# Patient Record
Sex: Male | Born: 1995 | Hispanic: Yes | Marital: Single | Smoking: Current some day smoker
Health system: Southern US, Community
[De-identification: ages and names within clinical notes are randomized; demographics above are authoritative.]

---

## 2014-10-19 ENCOUNTER — Encounter (HOSPITAL_COMMUNITY): Payer: Self-pay | Admitting: *Deleted

## 2014-10-19 ENCOUNTER — Emergency Department (HOSPITAL_COMMUNITY): Payer: Self-pay

## 2014-10-19 ENCOUNTER — Emergency Department (HOSPITAL_COMMUNITY)
Admission: EM | Admit: 2014-10-19 | Discharge: 2014-10-20 | Disposition: A | Discharge status: Home / Self Care | Payer: Self-pay | Attending: Emergency Medicine | Admitting: Emergency Medicine

## 2014-10-19 DIAGNOSIS — R000 Tachycardia, unspecified: Secondary | ICD-10-CM | POA: Insufficient documentation

## 2014-10-19 DIAGNOSIS — Y999 Unspecified external cause status: Secondary | ICD-10-CM | POA: Insufficient documentation

## 2014-10-19 DIAGNOSIS — Y929 Unspecified place or not applicable: Secondary | ICD-10-CM | POA: Insufficient documentation

## 2014-10-19 DIAGNOSIS — W2209XA Striking against other stationary object, initial encounter: Secondary | ICD-10-CM | POA: Insufficient documentation

## 2014-10-19 DIAGNOSIS — Z720 Tobacco use: Secondary | ICD-10-CM | POA: Insufficient documentation

## 2014-10-19 DIAGNOSIS — Y939 Activity, unspecified: Secondary | ICD-10-CM | POA: Insufficient documentation

## 2014-10-19 DIAGNOSIS — F1010 Alcohol abuse, uncomplicated: Secondary | ICD-10-CM

## 2014-10-19 DIAGNOSIS — S51811A Laceration without foreign body of right forearm, initial encounter: Secondary | ICD-10-CM | POA: Insufficient documentation

## 2014-10-19 DIAGNOSIS — S41111S Laceration without foreign body of right upper arm, sequela: Secondary | ICD-10-CM

## 2014-10-19 MED ORDER — SODIUM CHLORIDE 0.9 % IV BOLUS (SEPSIS): 1000.0000 mL | Freq: Once | INTRAVENOUS | Status: DC

## 2014-10-19 MED ORDER — LIDOCAINE-EPINEPHRINE 2 %-1:100000 IJ SOLN
20.0000 mL | Freq: Once | INTRAMUSCULAR | Status: DC
  Filled 2014-10-19: qty 20

## 2014-10-19 MED ORDER — TETANUS-DIPHTH-ACELL PERTUSSIS 5-2.5-18.5 LF-MCG/0.5 IM SUSP
0.5000 mL | Freq: Once | INTRAMUSCULAR | Status: AC
  Administered 2014-10-19: 0.5 mL via INTRAMUSCULAR
  Filled 2014-10-19: qty 0.5

## 2014-10-19 MED ORDER — TETANUS-DIPHTH-ACELL PERTUSSIS 5-2.5-18.5 LF-MCG/0.5 IM SUSP: 0.5000 mL | Freq: Once | INTRAMUSCULAR | Status: DC

## 2014-10-19 MED ORDER — LIDOCAINE-EPINEPHRINE (PF) 2 %-1:200000 IJ SOLN
20.0000 mL | Freq: Once | INTRAMUSCULAR | Status: AC
  Administered 2014-10-19: 20 mL via INTRADERMAL
  Filled 2014-10-19: qty 20
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
## 2014-10-19 NOTE — ED Notes (Signed)
MD at bedside. 

## 2014-10-19 NOTE — ED Notes (Signed)
Spoke with pt through interepreter services. Advised pt not to get out of wheelchair or try to walk because of the alcohol he consumed today. Pt stated his understanding through the interpreter.

## 2014-10-20 MED ORDER — SODIUM CHLORIDE 0.9 % IV BOLUS (SEPSIS)
2000.0000 mL | Freq: Once | INTRAVENOUS | Status: AC
  Administered 2014-10-20: 2000 mL via INTRAVENOUS

## 2014-10-20 NOTE — Progress Notes (Signed)
LCSW located patient's address where he was picked up by EMS. Called interpreter in effort to help communicate with patient and arranged a taxi for patient to be taken back home.  No other needs.  All family members have been exhausted to try and reach. [place] from [place] and has no idea where he is going or living.  Deretha [facility], MSW Clinical Social Work: Emergency Room [phone]
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
